# Patient Record
Sex: Female | Born: 1960 | Race: White | Hispanic: No
Health system: Southern US, Community
[De-identification: ages and names within clinical notes are randomized; demographics above are authoritative.]

---

## 2006-05-28 ENCOUNTER — Emergency Department: Payer: Self-pay | Admitting: Emergency Medicine

## 2006-06-03 ENCOUNTER — Emergency Department: Payer: Self-pay | Admitting: Emergency Medicine

## 2006-10-27 ENCOUNTER — Emergency Department: Payer: Self-pay | Admitting: Emergency Medicine

## 2006-11-19 ENCOUNTER — Other Ambulatory Visit: Payer: Self-pay

## 2006-11-19 ENCOUNTER — Emergency Department: Payer: Self-pay | Admitting: Emergency Medicine

## 2007-08-07 ENCOUNTER — Emergency Department: Payer: Self-pay | Admitting: Emergency Medicine

## 2007-08-08 ENCOUNTER — Emergency Department: Payer: Self-pay | Admitting: Emergency Medicine

## 2007-08-08 ENCOUNTER — Other Ambulatory Visit: Payer: Self-pay

## 2009-01-23 ENCOUNTER — Emergency Department: Payer: Self-pay | Admitting: Emergency Medicine

## 2009-02-27 ENCOUNTER — Emergency Department: Payer: Self-pay | Admitting: Emergency Medicine

## 2009-10-25 ENCOUNTER — Inpatient Hospital Stay: Payer: Self-pay | Admitting: Internal Medicine

## 2009-10-29 ENCOUNTER — Inpatient Hospital Stay: Payer: Self-pay | Admitting: Psychiatry

## 2009-11-17 ENCOUNTER — Inpatient Hospital Stay: Payer: Self-pay | Admitting: Internal Medicine

## 2010-05-10 ENCOUNTER — Ambulatory Visit: Payer: Self-pay

## 2010-10-06 ENCOUNTER — Emergency Department: Payer: Self-pay | Admitting: Unknown Physician Specialty

## 2011-06-02 ENCOUNTER — Emergency Department: Payer: Self-pay | Admitting: Emergency Medicine

## 2011-06-03 IMAGING — CT CT CHEST W/ CM
1 series · 15 of 32 positions shown, 19 images · IV contrast (APPLIED)
Comparison: none

REASON FOR EXAM: sob
COMMENTS:

PROCEDURE:     CT  - CT CHEST (FOR PE) W  - October 25, 2009  [DATE]
RESULT:     Chest CT dated 10/25/2008
TECHNIQUE: Helical 3 mm sections were obtained from the thoracic inlet
through the lung bases status post intravenous administration of 100 mL of
[id number].

[Series 4: soft tissue · axial · 0.62mm/px · z∈[-350,-96]mm · 15 of 96 slices shown, 19 images]
[im 7/96  soft-tissue]
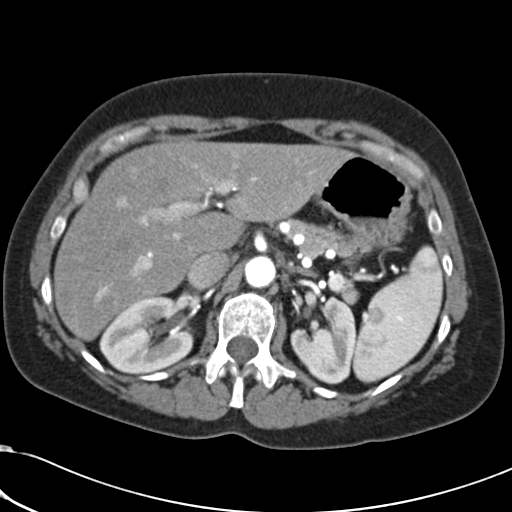
[im 7/96  bone]
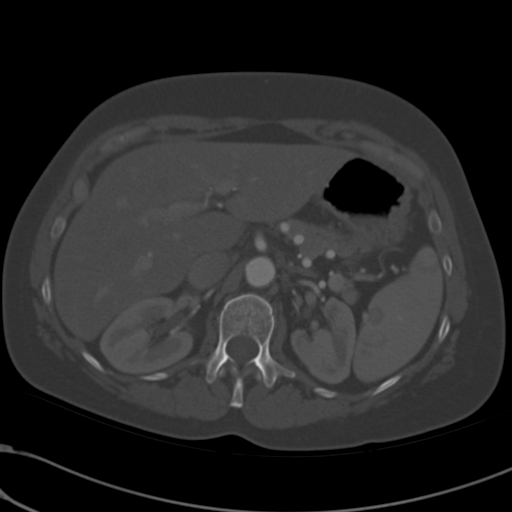
[im 13/96  soft-tissue]
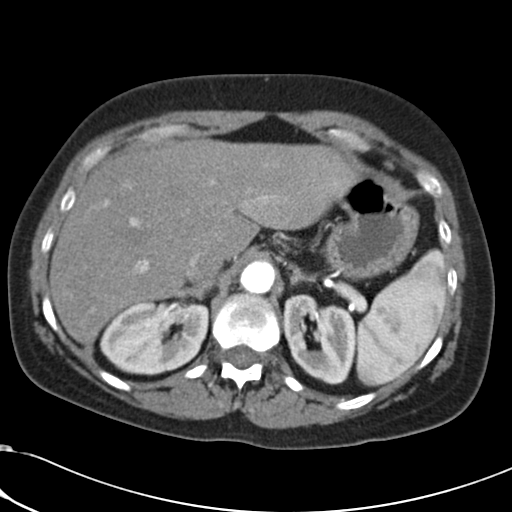
[im 19/96  soft-tissue]
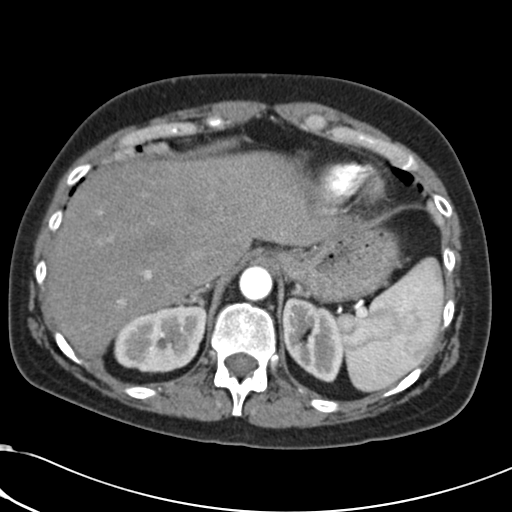
[im 28/96  soft-tissue]
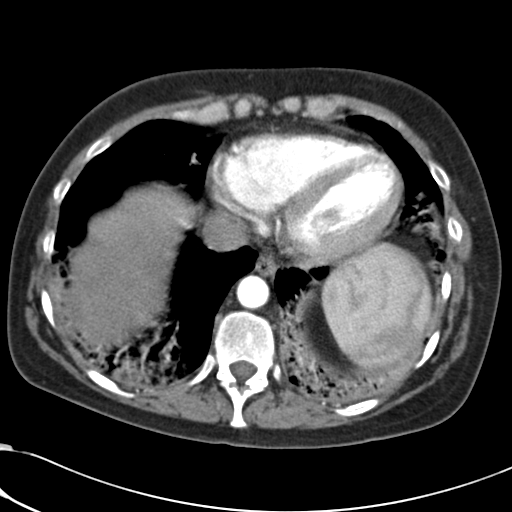
[im 34/96  soft-tissue]
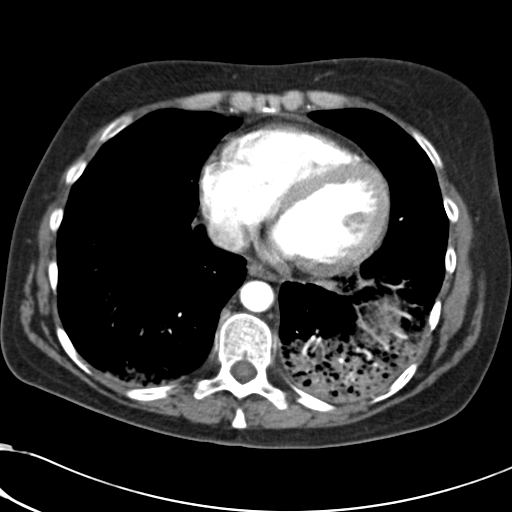
[im 40/96  soft-tissue]
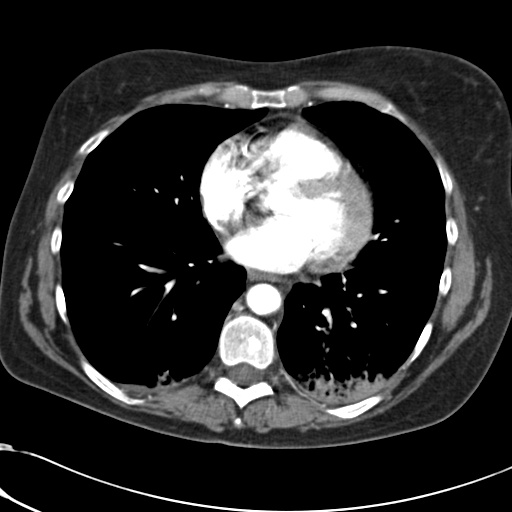
[im 50/96  soft-tissue]
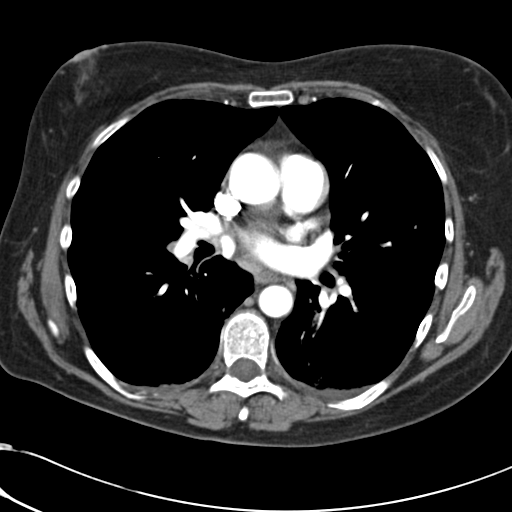
[im 56/96  soft-tissue]
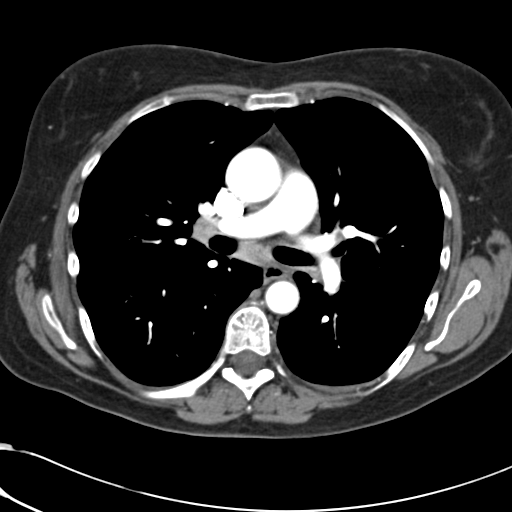
[im 62/96  soft-tissue]
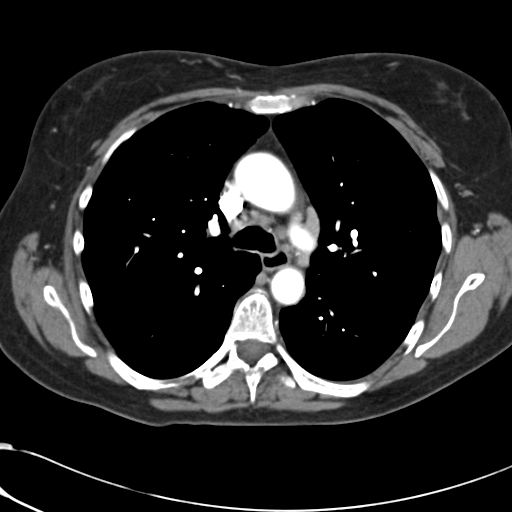
[im 62/96  bone]
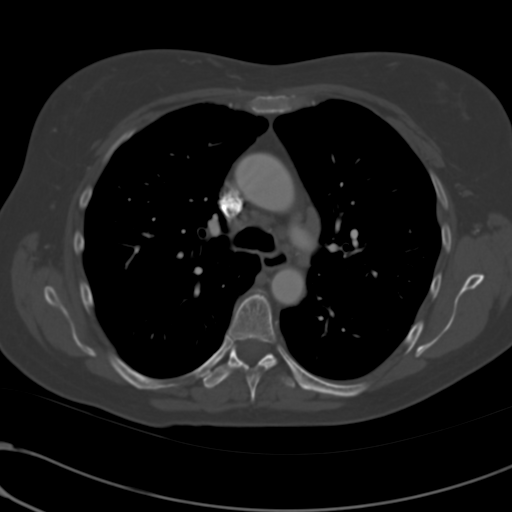
[im 68/96  soft-tissue]
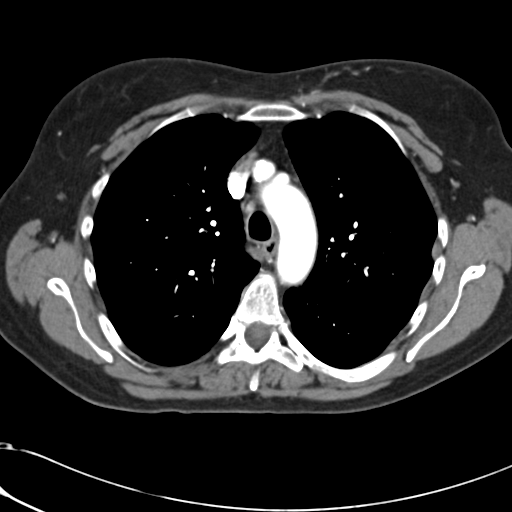
[im 77/96  soft-tissue]
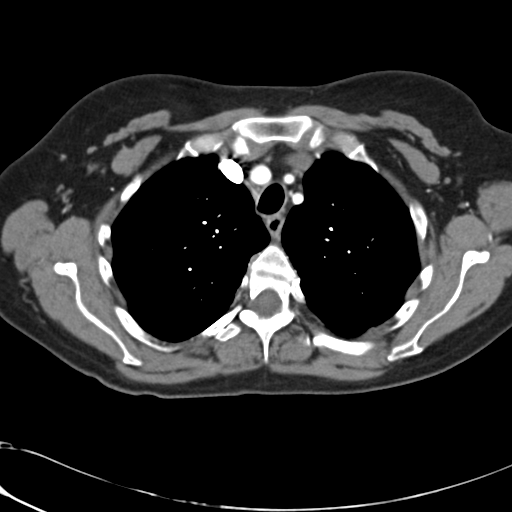
[im 83/96  soft-tissue]
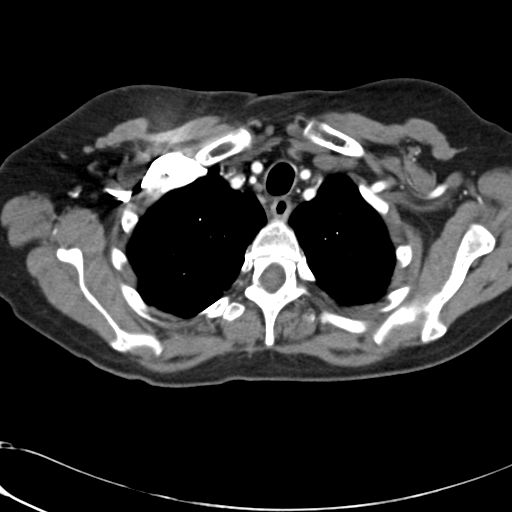
[im 83/96  lung]
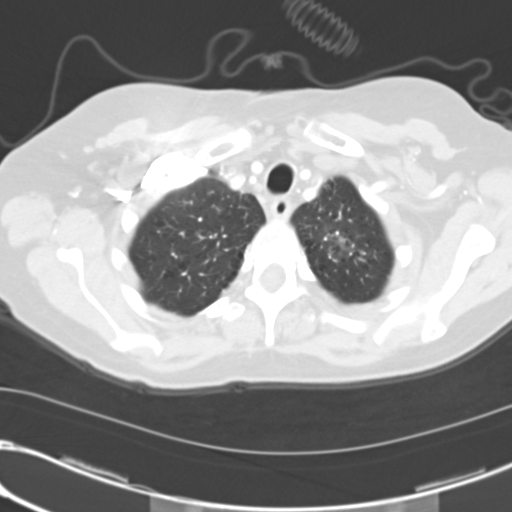
[im 86/96  lung]
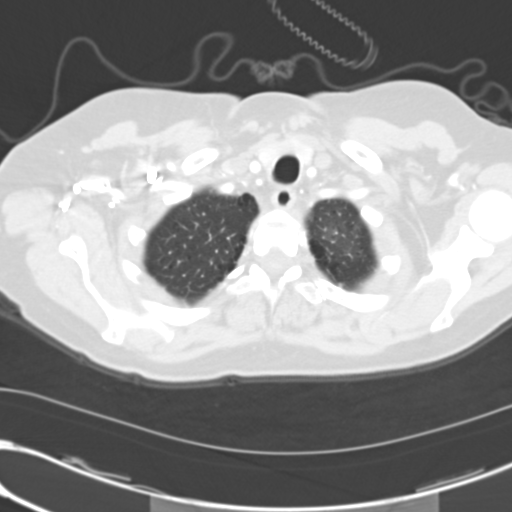
[im 89/96  soft-tissue]
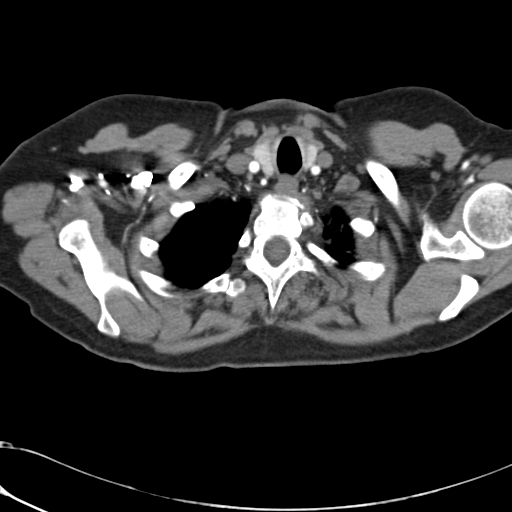
[im 89/96  lung]
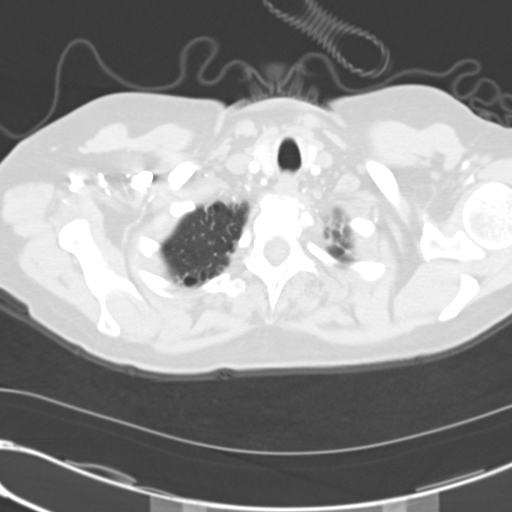
[im 92/96  lung]
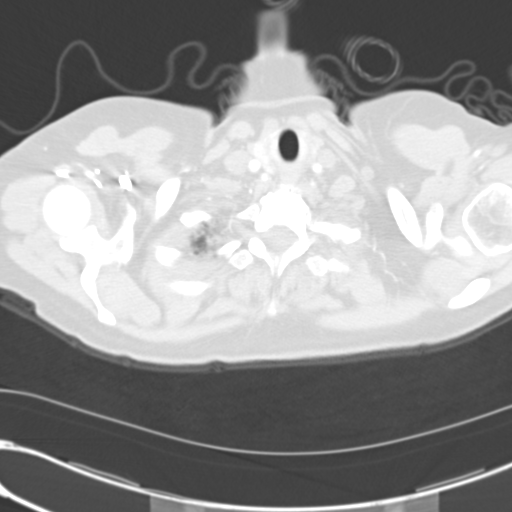

[15 of 32 positions shown; findings below may reference images not displayed]

FINDINGS: The mediastinum and hilar regions and structures demonstrates a
large lymph nodes an the AP window the largest measuring 2.39 x 1.08 cm.
Smaller paratracheal lymph nodes and infra- carinal lymph nodes identified.
Prominent bilateral hilar nodes are identified. There is no evidence of
filling defects within the main lobar or segmental pulmonary arteries. Is no
evidence of mediastinal masses. Evaluation of the lung parenchyma
demonstrates Caroline or infiltrates left greater than right. Very mild
ill-defined areas of increased interstitial densities are identified within
the remaining lung parenchyma on the right and left.

The visualized upper abdominal viscera demonstrate no gross abnormalities.
IMPRESSION: 1. No CT evidence of pulmonary arterial embolic disease
2. Bibasal airspace disease left greater than right has the appearance of
bibasal infiltrates.
3. Milder areas of increased interstitial densities are appreciated within
the remaining aerated portions of the lungs particularly within the lower
lobes also likely represent a component of an interstitial infiltrate.

## 2011-07-15 ENCOUNTER — Emergency Department: Payer: Self-pay | Admitting: Emergency Medicine

## 2011-08-05 ENCOUNTER — Inpatient Hospital Stay: Payer: Self-pay | Admitting: *Deleted

## 2011-08-13 ENCOUNTER — Emergency Department: Payer: Self-pay | Admitting: *Deleted

## 2011-10-16 ENCOUNTER — Emergency Department: Payer: Self-pay | Admitting: Emergency Medicine

## 2011-10-18 ENCOUNTER — Emergency Department: Payer: Self-pay | Admitting: *Deleted

## 2011-10-20 ENCOUNTER — Emergency Department: Payer: Self-pay | Admitting: *Deleted

## 2012-01-20 ENCOUNTER — Ambulatory Visit: Payer: Self-pay | Admitting: Internal Medicine

## 2012-01-26 ENCOUNTER — Inpatient Hospital Stay: Payer: Self-pay | Admitting: Internal Medicine

## 2012-01-26 LAB — COMPREHENSIVE METABOLIC PANEL
Albumin: 3.5 g/dL (ref 3.4–5.0)
Alkaline Phosphatase: 110 U/L (ref 50–136)
BUN: 22 mg/dL — ABNORMAL HIGH (ref 7–18)
Calcium, Total: 8.1 mg/dL — ABNORMAL LOW (ref 8.5–10.1)
Chloride: 105 mmol/L (ref 98–107)
Creatinine: 2.25 mg/dL — ABNORMAL HIGH (ref 0.60–1.30)
EGFR (African American): 30 — ABNORMAL LOW
EGFR (Non-African Amer.): 24 — ABNORMAL LOW
Glucose: 86 mg/dL (ref 65–99)
Osmolality: 282 (ref 275–301)
SGOT(AST): 141 U/L — ABNORMAL HIGH (ref 15–37)
SGPT (ALT): 42 U/L
Sodium: 140 mmol/L (ref 136–145)
Total Protein: 7.4 g/dL (ref 6.4–8.2)

## 2012-01-26 LAB — DRUG SCREEN, URINE
Barbiturates, Ur Screen: NEGATIVE (ref ?–200)
Cocaine Metabolite,Ur ~~LOC~~: POSITIVE (ref ?–300)
MDMA (Ecstasy)Ur Screen: NEGATIVE (ref ?–500)
Methadone, Ur Screen: NEGATIVE (ref ?–300)
Opiate, Ur Screen: POSITIVE (ref ?–300)

## 2012-01-26 LAB — CBC
HCT: 36.4 % (ref 35.0–47.0)
HGB: 12.1 g/dL (ref 12.0–16.0)
MCH: 32.4 pg (ref 26.0–34.0)
MCHC: 33.3 g/dL (ref 32.0–36.0)
Platelet: 220 10*3/uL (ref 150–440)
RBC: 3.74 10*6/uL — ABNORMAL LOW (ref 3.80–5.20)
RDW: 14.8 % — ABNORMAL HIGH (ref 11.5–14.5)
WBC: 12.6 10*3/uL — ABNORMAL HIGH (ref 3.6–11.0)

## 2012-01-26 LAB — CK: CK, Total: 2652 U/L — ABNORMAL HIGH (ref 21–215)

## 2012-01-26 LAB — URINALYSIS, COMPLETE
Bilirubin,UR: NEGATIVE
Glucose,UR: NEGATIVE mg/dL (ref 0–75)
Ketone: NEGATIVE
Protein: 100
RBC,UR: 1 /HPF (ref 0–5)
Specific Gravity: 1.011 (ref 1.003–1.030)
Squamous Epithelial: 1

## 2012-01-26 LAB — TROPONIN I: Troponin-I: 0.89 ng/mL — ABNORMAL HIGH

## 2012-01-27 DIAGNOSIS — R748 Abnormal levels of other serum enzymes: Secondary | ICD-10-CM

## 2012-01-27 LAB — CBC WITH DIFFERENTIAL/PLATELET
Basophil #: 0 10*3/uL (ref 0.0–0.1)
Eosinophil %: 0.1 %
HCT: 35.7 % (ref 35.0–47.0)
HGB: 12 g/dL (ref 12.0–16.0)
Lymphocyte %: 5.4 %
MCH: 32 pg (ref 26.0–34.0)
MCHC: 33.6 g/dL (ref 32.0–36.0)
MCV: 96 fL (ref 80–100)
Monocyte %: 11.5 %
Neutrophil #: 10 10*3/uL — ABNORMAL HIGH (ref 1.4–6.5)
Platelet: 252 10*3/uL (ref 150–440)
RDW: 14.8 % — ABNORMAL HIGH (ref 11.5–14.5)
WBC: 12.1 10*3/uL — ABNORMAL HIGH (ref 3.6–11.0)

## 2012-01-27 LAB — TROPONIN I
Troponin-I: 7.8 ng/mL — ABNORMAL HIGH
Troponin-I: 7.72 ng/mL — ABNORMAL HIGH

## 2012-01-27 LAB — BASIC METABOLIC PANEL
BUN: 27 mg/dL — ABNORMAL HIGH (ref 7–18)
Calcium, Total: 7.3 mg/dL — ABNORMAL LOW (ref 8.5–10.1)
Chloride: 106 mmol/L (ref 98–107)
Co2: 24 mmol/L (ref 21–32)
EGFR (African American): 35 — ABNORMAL LOW
EGFR (Non-African Amer.): 29 — ABNORMAL LOW
Glucose: 93 mg/dL (ref 65–99)
Potassium: 5.1 mmol/L (ref 3.5–5.1)

## 2012-01-27 LAB — CK TOTAL AND CKMB (NOT AT ARMC)
CK, Total: 9476 U/L — ABNORMAL HIGH (ref 21–215)
CK, Total: 7135 U/L — ABNORMAL HIGH (ref 21–215)
CK-MB: 151.3 ng/mL — ABNORMAL HIGH (ref 0.5–3.6)

## 2012-01-28 DIAGNOSIS — I059 Rheumatic mitral valve disease, unspecified: Secondary | ICD-10-CM

## 2012-01-28 LAB — COMPREHENSIVE METABOLIC PANEL
Alkaline Phosphatase: 73 U/L (ref 50–136)
BUN: 18 mg/dL (ref 7–18)
Creatinine: 0.96 mg/dL (ref 0.60–1.30)
EGFR (African American): >60
Glucose: 125 mg/dL — ABNORMAL HIGH (ref 65–99)
Osmolality: 290 (ref 275–301)
Potassium: 4.6 mmol/L (ref 3.5–5.1)
SGPT (ALT): 57 U/L
Sodium: 144 mmol/L (ref 136–145)
Total Protein: 6 g/dL — ABNORMAL LOW (ref 6.4–8.2)

## 2012-01-28 LAB — CBC WITH DIFFERENTIAL/PLATELET
Basophil %: 0.6 %
Eosinophil %: 0 %
HGB: 11.2 g/dL — ABNORMAL LOW (ref 12.0–16.0)
MCH: 32.4 pg (ref 26.0–34.0)
MCHC: 33.8 g/dL (ref 32.0–36.0)
MCV: 96 fL (ref 80–100)
Neutrophil #: 9.5 10*3/uL — ABNORMAL HIGH (ref 1.4–6.5)
Neutrophil %: 85.9 %
Platelet: 176 10*3/uL (ref 150–440)
RDW: 14.6 % — ABNORMAL HIGH (ref 11.5–14.5)
WBC: 11.1 10*3/uL — ABNORMAL HIGH (ref 3.6–11.0)

## 2012-01-29 LAB — BASIC METABOLIC PANEL
BUN: 18 mg/dL (ref 7–18)
Co2: 23 mmol/L (ref 21–32)
Creatinine: 0.87 mg/dL (ref 0.60–1.30)
EGFR (Non-African Amer.): >60
Glucose: 128 mg/dL — ABNORMAL HIGH (ref 65–99)
Potassium: 3.5 mmol/L (ref 3.5–5.1)

## 2012-01-29 LAB — CBC WITH DIFFERENTIAL/PLATELET
Basophil %: 0 %
Eosinophil #: 0 10*3/uL (ref 0.0–0.7)
HGB: 10 g/dL — ABNORMAL LOW (ref 12.0–16.0)
Lymphocyte %: 2.8 %
MCH: 32.1 pg (ref 26.0–34.0)
MCV: 95 fL (ref 80–100)
Monocyte %: 4.1 %
Neutrophil #: 9.6 10*3/uL — ABNORMAL HIGH (ref 1.4–6.5)
Neutrophil %: 93.1 %
Platelet: 158 10*3/uL (ref 150–440)
RBC: 3.12 10*6/uL — ABNORMAL LOW (ref 3.80–5.20)
RDW: 14.4 % (ref 11.5–14.5)

## 2012-01-29 LAB — URINE CULTURE

## 2012-01-29 LAB — CK: CK, Total: 2190 U/L — ABNORMAL HIGH (ref 21–215)

## 2012-01-29 LAB — TROPONIN I: Troponin-I: 2.63 ng/mL — ABNORMAL HIGH

## 2012-01-29 LAB — CK TOTAL AND CKMB (NOT AT ARMC): CK-MB: 7.6 ng/mL — ABNORMAL HIGH (ref 0.5–3.6)

## 2012-01-30 LAB — CK TOTAL AND CKMB (NOT AT ARMC): CK-MB: 3.6 ng/mL (ref 0.5–3.6)

## 2012-01-30 LAB — CBC WITH DIFFERENTIAL/PLATELET
Basophil #: 0 10*3/uL (ref 0.0–0.1)
Eosinophil #: 0 10*3/uL (ref 0.0–0.7)
HCT: 28.7 % — ABNORMAL LOW (ref 35.0–47.0)
HGB: 9.6 g/dL — ABNORMAL LOW (ref 12.0–16.0)
Lymphocyte %: 3.8 %
MCH: 31.8 pg (ref 26.0–34.0)
MCHC: 33.3 g/dL (ref 32.0–36.0)
MCV: 96 fL (ref 80–100)
Monocyte %: 5 %
Neutrophil #: 8.7 10*3/uL — ABNORMAL HIGH (ref 1.4–6.5)
Neutrophil %: 91.2 %
Platelet: 169 10*3/uL (ref 150–440)
RBC: 3 10*6/uL — ABNORMAL LOW (ref 3.80–5.20)
RDW: 14.7 % — ABNORMAL HIGH (ref 11.5–14.5)
WBC: 9.5 10*3/uL (ref 3.6–11.0)

## 2012-01-30 LAB — BASIC METABOLIC PANEL
Co2: 23 mmol/L (ref 21–32)
Creatinine: 1.06 mg/dL (ref 0.60–1.30)
EGFR (African American): >60
EGFR (Non-African Amer.): 58 — ABNORMAL LOW
Glucose: 126 mg/dL — ABNORMAL HIGH (ref 65–99)
Potassium: 3.5 mmol/L (ref 3.5–5.1)
Sodium: 143 mmol/L (ref 136–145)

## 2012-01-30 LAB — PHOSPHORUS: Phosphorus: 2.6 mg/dL (ref 2.5–4.9)

## 2012-01-30 LAB — VANCOMYCIN, TROUGH: Vancomycin, Trough: 20 ug/mL (ref 10–20)

## 2012-02-01 LAB — BASIC METABOLIC PANEL
BUN: 38 mg/dL — ABNORMAL HIGH (ref 7–18)
Chloride: 114 mmol/L — ABNORMAL HIGH (ref 98–107)
Co2: 22 mmol/L (ref 21–32)
Creatinine: 0.93 mg/dL (ref 0.60–1.30)
EGFR (African American): >60
Glucose: 143 mg/dL — ABNORMAL HIGH (ref 65–99)
Osmolality: 300 (ref 275–301)
Sodium: 145 mmol/L (ref 136–145)

## 2012-02-01 LAB — CBC WITH DIFFERENTIAL/PLATELET
Basophil #: 0 10*3/uL (ref 0.0–0.1)
HCT: 30.5 % — ABNORMAL LOW (ref 35.0–47.0)
MCH: 31.7 pg (ref 26.0–34.0)
MCV: 95 fL (ref 80–100)
Monocyte #: 0.6 x10 3/mm (ref 0.2–0.9)
Monocyte %: 6.7 %
Neutrophil %: 87.1 %
RBC: 3.23 10*6/uL — ABNORMAL LOW (ref 3.80–5.20)

## 2012-02-01 LAB — LIPID PANEL
Cholesterol: 149 mg/dL (ref 0–200)
HDL Cholesterol: 31 mg/dL — ABNORMAL LOW (ref 40–60)
Ldl Cholesterol, Calc: 93 mg/dL (ref 0–100)
Triglycerides: 125 mg/dL (ref 0–200)
VLDL Cholesterol, Calc: 25 mg/dL (ref 5–40)

## 2012-02-01 LAB — CULTURE, BLOOD (SINGLE)

## 2012-02-02 LAB — COMPREHENSIVE METABOLIC PANEL
Albumin: 2.4 g/dL — ABNORMAL LOW (ref 3.4–5.0)
Alkaline Phosphatase: 64 U/L (ref 50–136)
Anion Gap: 9 (ref 7–16)
Chloride: 113 mmol/L — ABNORMAL HIGH (ref 98–107)
Co2: 27 mmol/L (ref 21–32)
EGFR (African American): >60
Glucose: 88 mg/dL (ref 65–99)

## 2012-02-03 LAB — BASIC METABOLIC PANEL
Anion Gap: 9 (ref 7–16)
BUN: 31 mg/dL — ABNORMAL HIGH (ref 7–18)
Calcium, Total: 8.3 mg/dL — ABNORMAL LOW (ref 8.5–10.1)
Chloride: 106 mmol/L (ref 98–107)
Co2: 27 mmol/L (ref 21–32)
Creatinine: 0.86 mg/dL (ref 0.60–1.30)
EGFR (African American): >60
Sodium: 142 mmol/L (ref 136–145)

## 2012-02-05 LAB — PLATELET COUNT: Platelet: 236 10*3/uL (ref 150–440)

## 2012-02-19 ENCOUNTER — Ambulatory Visit: Payer: Self-pay | Admitting: Internal Medicine

## 2013-09-05 IMAGING — US US CAROTID DUPLEX BILAT
1 series · 17 of 24 positions shown · non-contrast
Comparison: none

REASON FOR EXAM: acute stroke
COMMENTS:

[Series 1: us carotid duplex bilat · 17 of 55 slices shown]
[im 1/55]
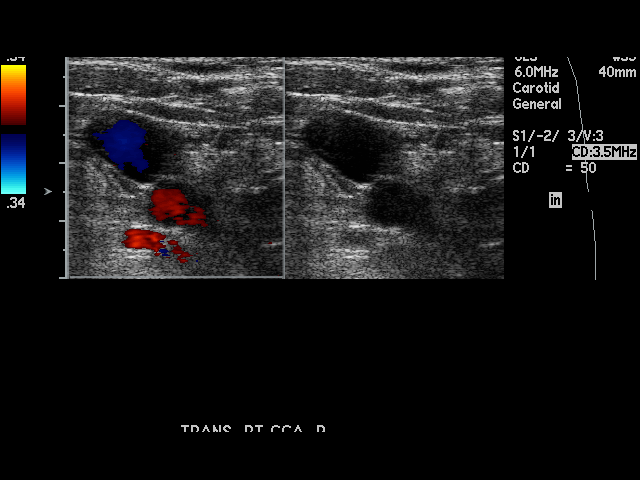
[im 5/55]
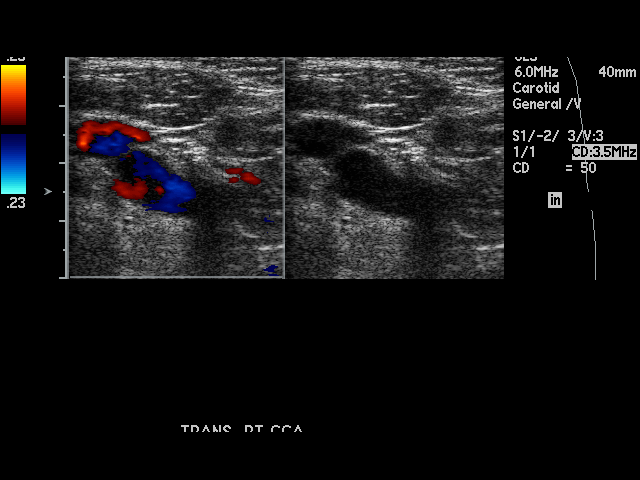
[im 8/55]
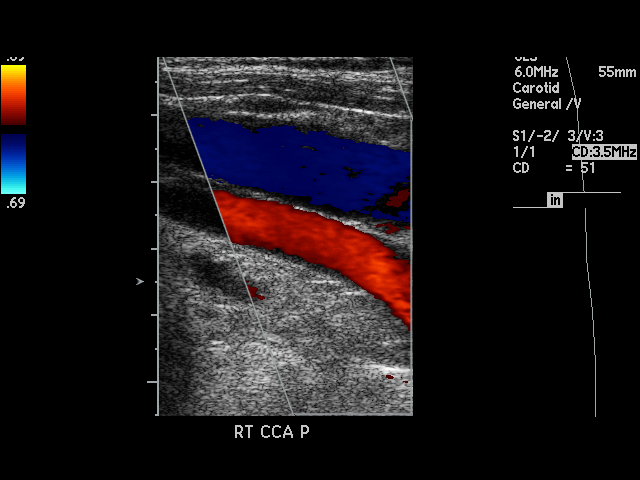
[im 10/55]
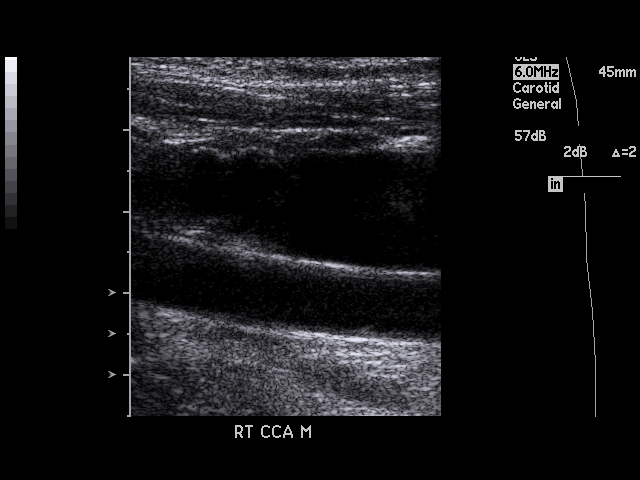
[im 15/55]
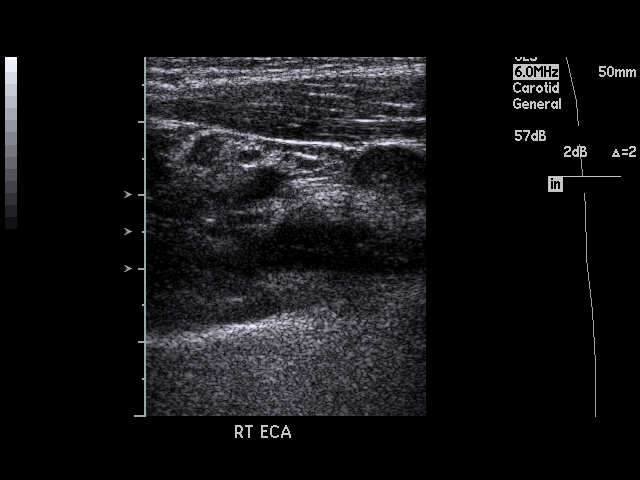
[im 17/55]
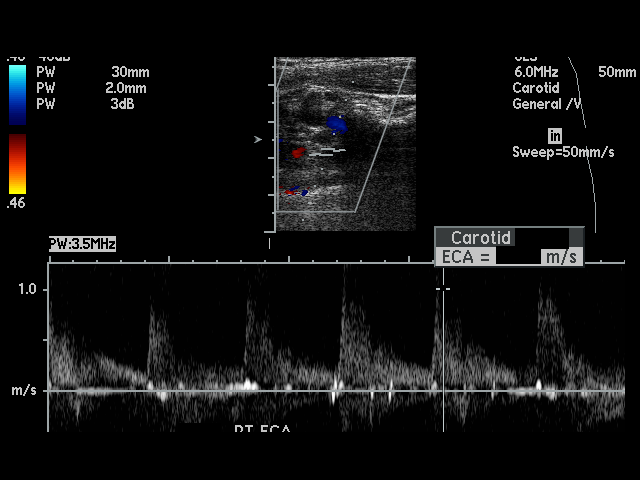
[im 22/55]
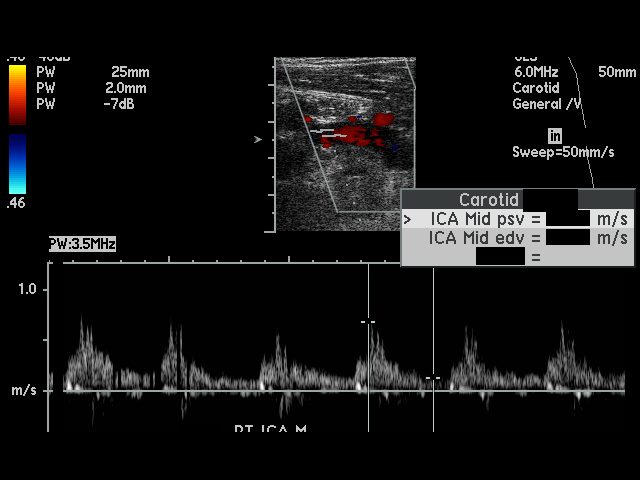
[im 24/55]
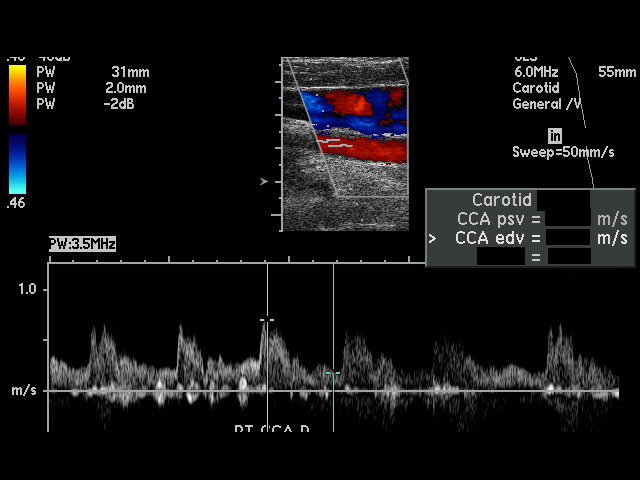
[im 29/55]
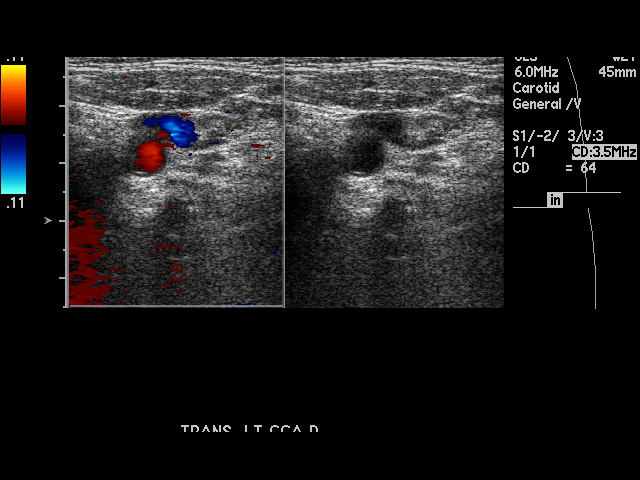
[im 31/55]
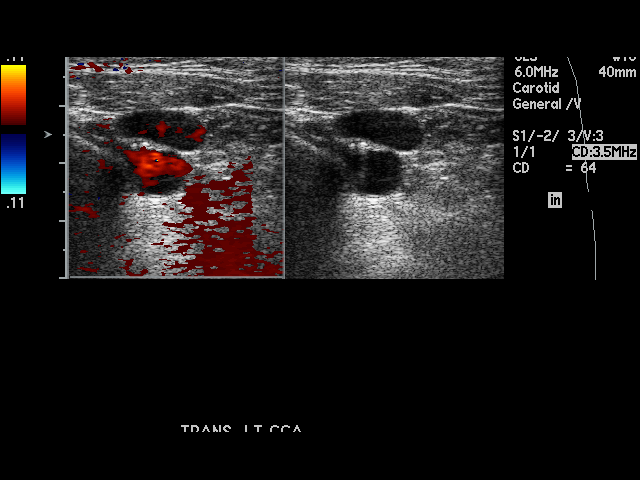
[im 33/55]
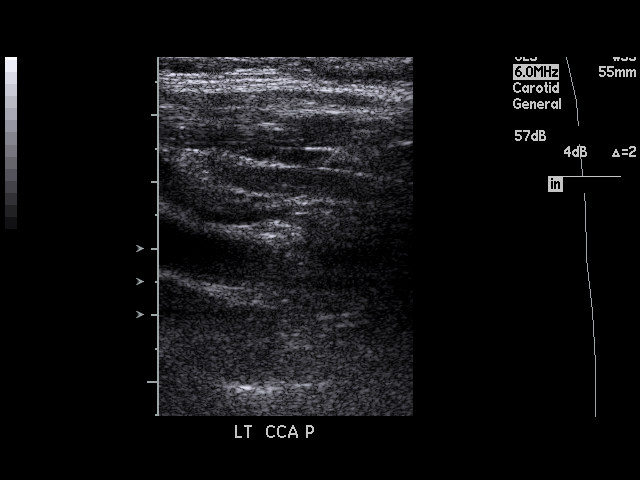
[im 38/55]
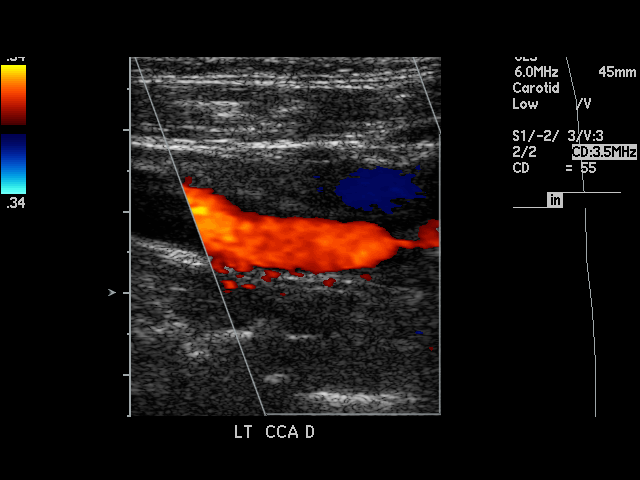
[im 40/55]
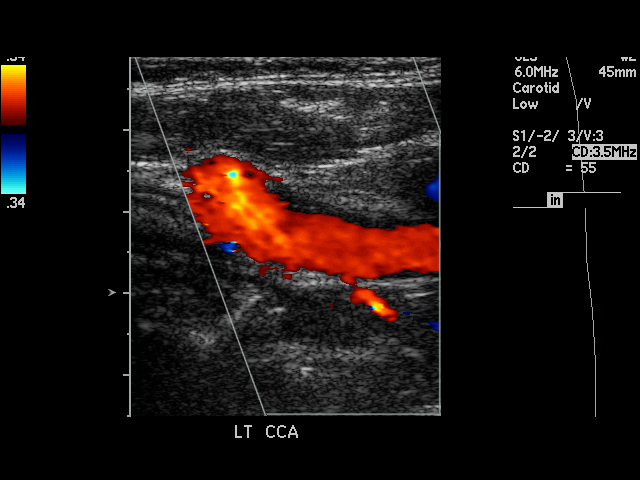
[im 45/55]
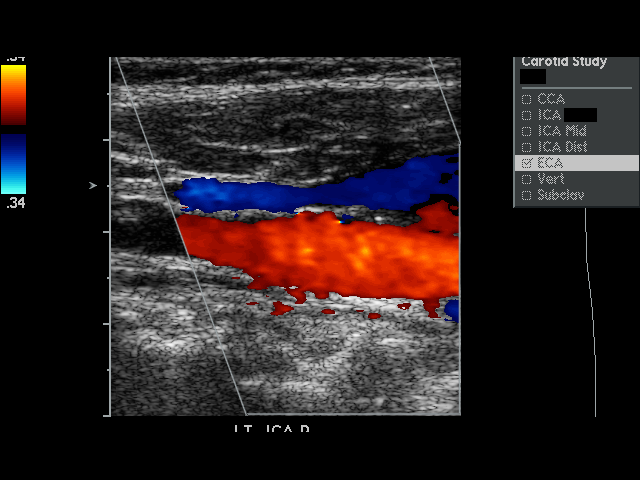
[im 47/55]
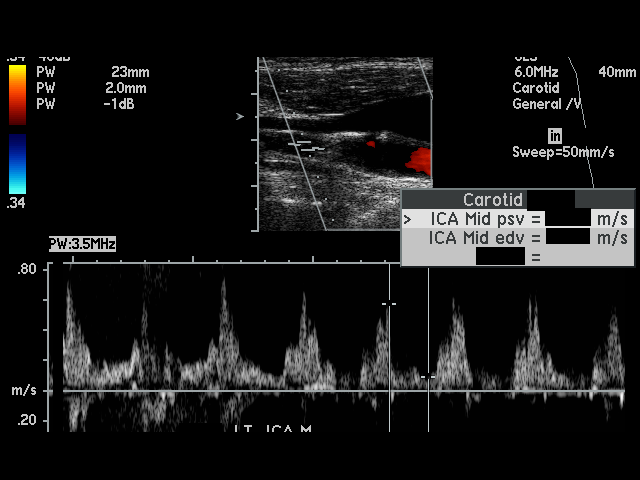
[im 50/55]
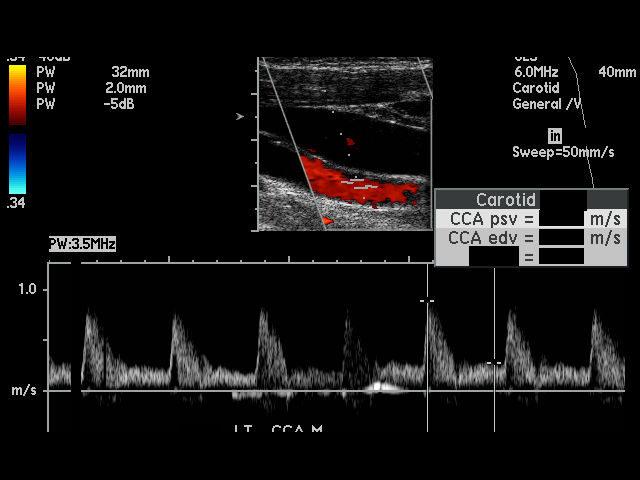
[im 55/55]
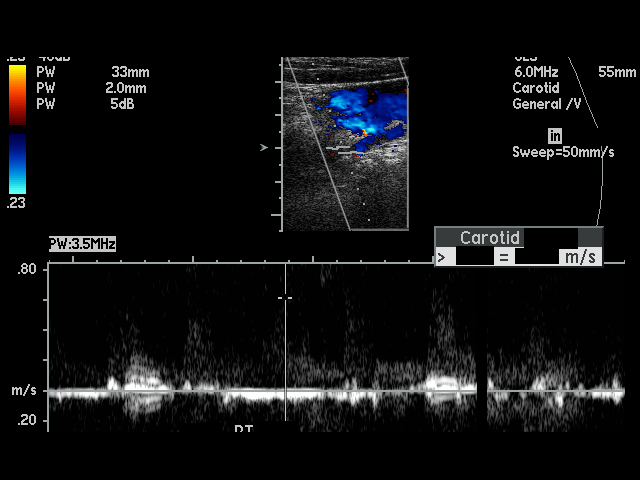

[17 of 24 positions shown; findings below may reference images not displayed]

PROCEDURE:     US  - US CAROTID DOPPLER BILATERAL  - January 28, 2012  [DATE]

RESULT:     There is noted slight calcific plaque formation about the
carotid bifurcations bilaterally. On the right, the peak right common
carotid artery flow velocity measure 0.767 meters per second and the peak
right internal carotid artery flow velocity measures 0.811 meters per
second. The ICA/CCA ratio is 1.057. On the left, the peak left common
carotid artery flow velocity measures 0.897 meters per second and the peak
left internal carotid artery flow velocity measures 0.704 meters per second.
The ICA/CCA ratio is 0.785. These values bilaterally are in the normal range
and are consistent with the bilateral absence of hemodynamically significant
stenosis.

There is antegrade flow in both vertebrals.
IMPRESSION: 1. No hemodynamically significant stenosis is identified on either side.
2. There is antegrade flow in both vertebrals.

## 2015-02-12 NOTE — Consult Note (Signed)
Comments   Events of morning noted. Pt now on bipap for respiratory distress. Remains agitated. Billey Chang, NP, and I met with pt's son and 2 sisters. Updated them on pt's current medical condition. Addressed code status. They are adamant that they do not want pt intubated. We discussed CPR/defibrillation and they do not want that in the event of cardiac arrest. Pt is DNR.  reviewed potential scenarios in the event pt survives this acute decompensation. Family remains determined to eventually get pt back home in their care. They would agree with PEG if needed. We continue to follow and meet with family as needed to assist with decision making. expressed appreciation for meeting. All questions answered.   Electronic Signatures: Demecia Northway, Izora Gala (MD)  (Signed 11-Apr-13 14:22)  Authored: Palliative Care   Last Updated: 11-Apr-13 14:22 by Mickle Campton, Izora Gala (MD)

## 2015-02-12 NOTE — Discharge Summary (Signed)
PATIENT NAME:  Janice OrionBRADSHAW, Ranita H MR#:  [id number] DATE OF BIRTH:  Jun 17, 1961  DATE OF ADMISSION:  01/26/2012 DATE OF DISCHARGE:  02/07/2012  ADDENDUM:  This is the final Discharge Summary and addendum to the Interim Discharge Summary dictated by Dr. Chales AbrahamsGupta.    HOSPITAL COURSE: I followed the patient on the 18th and 19th of April. She is doing well. She is being discharged to [facility] for further rehab. She will follow up with [facility] after discharge, phone number [phone].   DISCHARGE INSTRUCTIONS:  1. Physical therapy.  2. Mechanical soft diet, Ensure 240 mL b.i.d.  3. DuoNebs every 4 hours p.r.n.  4. Follow up with [facility] after discharge from rehab. Phone number is [phone].  5. Check serum Tegretol levels in 5 days.   DISCHARGE MEDICATIONS:  1. Albuterol oral inhaler 1 to 2 puffs every 6 hours p.r.n.  2. Metoprolol 25 mg b.i.d.  3. Aspirin 324 mg p.o. daily.  4. Hydralazine 25 mg p.o. t.i.d.  5. Amlodipine 10 mg daily.  6. Ativan 0.5 mg p.o. t.i.d. p.r.n.  7. Tegretol 200 mg p.o. b.i.d.  8. Zoloft 50 mg p.o. daily.  9. Prednisone taper 30 mg daily, taper x10 mg daily, then stop.   CODE STATUS:  NO CODE/DO NOT RESUSCITATE.    ____________________________ Wylie HailSona A. Allena KatzPatel, MD sap:cbb D: 02/07/2012 11:02:30 ET T: 02/07/2012 13:25:10 ET JOB#: [id number] Jearl KlinefelterSONA A Taelyn Broecker MD ELECTRONICALLY SIGNED 02/10/2012 13:20

## 2015-02-12 NOTE — Consult Note (Signed)
PATIENT NAME:  Janice Mccullough, Janice Mccullough MR#:  [id number] DATE OF BIRTH:  1961-04-21  DATE OF CONSULTATION:  01/29/2012  REFERRING PHYSICIAN:  Marlaine Hind, MD  CONSULTING PHYSICIAN:  Rose Phi. Kemper Durie, MD  HISTORY: Ms. Ferner is 54 year old white woman with reported history of bipolar disorder, hypertension, tobacco abuse, and COPD. She was admitted 01/26/2012 after being found unresponsive. She is referred for evaluation of unresponsive state, stroke, and hypothermia. History comes from her CCU nurse, conversation with Dr. Mariah Milling of Cardiology, and from her hospital chart.   The patient was brought to the Emergency Room at 8:30 p.m. on 01/26/2012 by EMTs summoned by a friend who had found the patient unresponsive. Initial vital signs in the Emergency Room included blood pressure of 129/71, heart rate 66, and temperature 89.4. Urine drug screen was positive for cocaine, opiates, and phencyclidine. It is  reported that the patient had been found unresponsive at approximately 6 a.m. the same day and that the call to 9-1-1 14 hours later had been made when the patient was still unresponsive, this delay reportedly possibly due to possibly illicit drug use, or allowing the patient to "sleep it off", or both and also concern about police involvement.   She was intubated and admitted to the CCU. Brain CT scan showed no bleed or mass. Scan showed areas of decreased density consistent with subacute infarction of bilateral cerebral hemispheres and basal ganglia areas bilaterally. She was seen by Dr. Mariah Milling of Cardiology for elevated enzymes, the patient was concluded to have had non-ST elevation myocardial infarction with question of demand ischemia secondary to hypoxia or cocaine or both.   The patient was extubated without complication on 01/27/2012. Brain MRI scan 01/28/2012 was of fair quality with regard to artifact, but confirmed areas of nonhemorrhagic infarction of left more than right cerebellum, possibly  adjacent pons, and areas of small infarction of bilateral basal ganglia regions and of periventricular white matter regions bilaterally.   PHYSICAL EXAMINATION: When seen the evening of 01/29/2012, the patient was examined lying semisupine in the CCU with nasal cannula oxygen in place. She was well developed, well nourished, appeared in no apparent distress, lying with her eyes closed. Blood pressure 160/95, heart rate 86, respirations 14 per minute. There was no fever. She was normocephalic without evidence of trauma and her neck was supple to passive movement. She was arousable to mild shaking or loud voice, briefly opening the eyelids. She could be induced to nod "yes" to Rosangela somewhat more than to other choices of name. She otherwise did not verbalize. With combination of loud voice, vigorous tactile cues, exhortation, and repeated requests, she could be induced to brief grip of the hands more so on the right than the left, to make some effort to raise the arms with better movement on the right than on the left, to extend legs and knees against resistance with more strength in the right than the left, command to briefly tap the right hand and tap the right foot, not the left hand or foot.   Her gaze was conjugate in the midline. She did not have roving eye movements throughout her left, but could have elicitable doll movement to each side of midline. Corneal responses were brisk bilaterally. Pupils were bilaterally 4 mm, decreased to 3 mm to light stimulus. Her visual acuity was grossly intact to visual threat. There was grossly symmetric facial grimace to noxious intranasal stimulation. She demonstrated localizing movement with each hand toward the face, better on the right  than left. Tone was mildly increased on the left compared to the right. Reflexes were all 2 to 3+, but more brisk on the left than the right. Babinski sign was absent bilaterally.   IMPRESSION: Left greater than right side weakness  with brain imaging studies showing multiple areas of recent infarction suspected associated with cocaine use, and decreased level of consciousness at this point of unclear etiology with differential diagnosis including involvement of brainstem areas not seen on suboptimal imaging study, and the possibility of diffuse anoxic brain insult.   RECOMMENDATIONS:  1. I agree with her present work-up and treatment in the hospital. 2. Repeat MRI scan a later time for better study.  3. EEG to evaluate level of slowing.  4. She will need a period of rehabilitation, [facility] at time of discharge from the hospital.   I appreciate being asked to see this interesting lady.  ____________________________ Rose PhiPeter R. Kemper Durielarke, MD prc:drc D: 01/29/2012 13:45:33 ET T: 01/29/2012 14:36:47 ET JOB#: [id number]  cc: Rose PhiPeter R. Kemper Durielarke, MD, <Dictator> Gaspar GarbePETER R Aliscia Clayton MD ELECTRONICALLY SIGNED 02/12/2012 11:04

## 2015-02-12 NOTE — H&P (Signed)
PATIENT NAME:  Janice Mccullough, Janice Mccullough MR#:  [id number] DATE OF BIRTH:  20-Oct-1961  DATE OF ADMISSION:  01/26/2012  PRIMARY CARE PHYSICIAN:  The patient does not have a local doctor.   CHIEF COMPLAINT: The patient was found unresponsive.   HISTORY OF PRESENT ILLNESS: Ms. Dubiel is a 54 year old Caucasian female with past medical history of chronic obstructive pulmonary disease, tobacco abuse, systemic hypertension, and bipolar disorder. The patient was found in her apartment by her friend unresponsive. The patient was transferred to the Emergency Department and evaluation here with CT scan revealed multiple cerebral and cerebellar infarcts. Along with that her drug screen was positive for cocaine, opiate, and phencyclidine. The patient's neurologic condition appears to be poor. She is intubated and connected to a ventilator.   REVIEW OF SYSTEMS: 10-point system review is unobtainable due to the patient's unresponsiveness and being on the ventilator.   PAST MEDICAL HISTORY:  1. History of chronic obstructive pulmonary disease. 2. Systemic hypertension.  3. Bipolar disorder.  4. Tobacco abuse.   ADMISSION MEDICATIONS: Per records, last admission here was in October 2012. When she was discharged she was on: 1. Valium 5 mg 4 times a day.  2. Tegretol 200 mg twice a day.  3. Norvasc 5 mg a day. 4. Norco 5/325 q. 6 hours p.r.n.  5. Zestril  20 mg a day. 6. Ambien 10 mg at bedtime. 7. Abilify 5 mg a day. 8. Advair 250/50 1 puff twice a day.   ALLERGIES: No known drug allergies.  SOCIAL HABITS: History of tobacco abuse. I could not assess her amount of tobacco or when she started smoking due to the patient's condition.  Per records she was not an alcoholic.   SOCIAL HISTORY: She lives at home, visited by a friend.   FAMILY HISTORY: According to the records, positive for hypertension and coronary artery disease.   PHYSICAL EXAMINATION:  VITAL SIGNS: Blood pressure 160/94, respiratory rate 14 on  ventilator, pulse 68, temperature 85.6, oxygen saturation 100% on oxygen supplementation 35%.   GENERAL APPEARANCE: Middle-aged female laying in bed, unresponsive, on ventilator.   HEENT: No pallor. No icterus. No cyanosis. ENT cannot be assessed.  The patient is on the ventilator and unresponsive. She is intubated orally.  Eyes- normal eyelids and conjunctivae. Pupils about 3 mm, unresponsive to light.   NECK: Supple. Trachea at midline. No thyromegaly. No cervical lymphadenopathy.   HEART: Faint S1, S2 or distant heart sounds secondary to her chronic obstructive pulmonary disease and also the noise from rhonchi and ventilator. No murmur was appreciated. No carotid bruits.   RESPIRATORY: Bilateral diffuse rhonchi and wheezing with slight prolongation of expiratory phase. No rales.   ABDOMEN: Slightly distended, soft without rigidity. No masses. No organomegaly.   MUSCULOSKELETAL: No joint swelling. No clubbing.   SKIN: No ulcers. No subcutaneous nodules.   NEUROLOGIC: The patient is unresponsive. No facial asymmetry. She is flaccid in four extremities. Plantar responses are zero, no upgoing or downgoing. Pupils about 3 mm, equal, nonreactive to light. Corneal reflex is absent.   LABORATORY, DIAGNOSTIC, AND RADIOLOGICAL DATA: CT scan of the head without contrast revealed extensive abnormal attenuation within the cerebellum consistent with subacute infarct. There is also abnormal attenuation within the bilateral caudate lobes and basal ganglia concerning for subacute lacunar infarct. The radiologist also reported that the fourth ventricle is small in size. The patient is at risk for hydrocephalus due to cerebellar edema. No hydrocephalus is seen on the current exam.  CT scan of  the abdomen and pelvis: There is linear atelectasis or fibrosis in the lung bases. Gallstones.  Mild mucosal thickening in small bowel loops. EKG showed ectopic atrial rhythm at the rate of 58 per minute. Otherwise  unremarkable EKG. Arterial  blood gas showed the following readings:  pH 7.2, pCO2 20, pO2 49. Subsequent ABG showed pH of 7.38, pCO2 35, and pO2 of 80. The final one was pH 7.6, pCO2 69, pO2 300. Serum glucose 86, BUN 22, creatinine 2.25, sodium 140, potassium 6.1. Creatinine in October of last year was 0.7. Her alcohol level is less than 0.003. Liver function tests were normal except for elevated AST at 141. Troponin is elevated at 0.89. Drug screen was positive for cocaine, opiates, and phencyclidine. CBC showed elevated white count at 12,600, hemoglobin 12, hematocrit 36, platelet count 220. Urinalysis showed cloudy urine, +3 blood, 9 white blood cells.   IMPRESSION:  1. Acute stroke.  2. Unresponsiveness. The patient is intubated on the ventilator. 3. Hypothermia. 4. Acute renal failure.  5. Hyperkalemia.  6. Elevated troponin with question of non-ST elevation acute myocardial infarction.  7. Drug overdose with cocaine, opiate, and phencyclidine. 8. Possible underlying urinary tract infection.  9. Chronic obstructive pulmonary disease.  10. Systemic hypertension.  11. History of bipolar disorder.  12. Prognosis is poor.   PLAN:  The patient will be admitted to the Intensive Care Unit. We will continue ventilatory support. Frequent neurologic examination and followup. Aspirin 325 mg once a day. Deep vein thrombosis prophylaxis with Lovenox 40 mg subcutaneous once a day. Peptic ulcer disease prophylaxis with Protonix 40 mg IV once a day. IV antibiotic Levaquin at 250 mg once a day for treatment of urinary tract infection pending results of the urine culture. Neurology consultation. Obtain MRI of the brain in the morning. Treatment of hypothermia is already started using heat method with [employer]. Her CODE STATUS right now is FULL CODE.  Prognosis is poor. We will monitor her progress.     TIME SPENT EVALUATING THIS PATIENT: More than one hour.     ____________________________ Carney CornersAmir M.  Rudene Rearwish, MD amd:bjt D: 01/26/2012 23:38:55 ET T: 01/27/2012 08:54:33 ET JOB#: [id number]  cc: Carney CornersAmir M. Rudene Rearwish, MD, <Dictator> Zollie ScaleAMIR M Garen Woolbright MD ELECTRONICALLY SIGNED 01/27/2012 23:40

## 2015-02-12 NOTE — Consult Note (Signed)
General Aspect 54 year old Caucasian female with past medical history of chronic obstructive pulmonary disease, tobacco abuse,  hypertension, bipolar disorder, recent admission in 07/2011, Presenting after being found down in her apartment by her friend, She was unresponsive when found. Cardiology was consulted for  elevated cardiac enz.  EMTs brought her to the ER. CT scan revealed multiple cerebral and cerebellar infarcts. drug screen was positive for cocaine, opiate, and phencyclidine.  She was intubated and connected to a ventilator on arrival.  Currently she is extubated, unable to arouse, on nasal canula, still on pressors for BP support.  PAST MEDICAL HISTORY:  1. History of chronic obstructive pulmonary disease. 2. Systemic hypertension.  3. Bipolar disorder.  4. Tobacco abuse.   ADMISSION MEDICATIONS: Per records from last admission 07/2011. 1. Valium 5 mg 4 times a day.  2. Tegretol 200 mg twice a day.  3. Norvasc 5 mg a day. 4. Norco 5/325 q. 6 hours p.r.n.  5. Zestril  20 mg a day. 6. Ambien 10 mg at bedtime. 7. Abilify 5 mg a day. 8. Advair 250/50 1 puff twice a day.   ALLERGIES: No known drug allergies.   SOCIAL HABITS: History of tobacco abuse.    Per records she was not an alcoholic.   SOCIAL HISTORY: She lives at home, visited by a friend.   FAMILY HISTORY:  positive for hypertension and coronary artery disease.   Physical Exam:   GEN well developed, well nourished, no acute distress    HEENT red conjunctivae    NECK supple     RESP normal resp effort  clear BS  snoring     CARD Regular rate and rhythm  No murmur     ABD denies tenderness  soft     LYMPH negative neck    EXTR negative edema    SKIN normal to palpation    NEURO unable to test    [facility] sedated   Review of Systems:   ROS Pt not able to provide ROS      Routine Hem:  07-Apr-13 21:03    WBC (CBC) 12.6   RBC (CBC) 3.74   Hemoglobin (CBC) 12.1   Hematocrit (CBC) 36.4    Platelet Count (CBC) 220   MCV 97   MCH 32.4   MCHC 33.3   RDW 14.8  Routine Chem:  07-Apr-13 21:03    Glucose, Serum 86   BUN 22   Creatinine (comp) 2.25   Sodium, Serum 140   Potassium, Serum 6.1   Chloride, Serum 105   CO2, Serum 24   Calcium (Total), Serum 8.1  Hepatic:  07-Apr-13 21:03    Bilirubin, Total 0.2   Alkaline Phosphatase 110   SGPT (ALT) 42   SGOT (AST) 141   Total Protein, Serum 7.4   Albumin, Serum 3.5  Routine Chem:  07-Apr-13 21:03    Osmolality (calc) 282   eGFR (African American) 30   eGFR (Non-African American) 24   Anion Gap 11  Cardiac:  07-Apr-13 21:03    Troponin I 0.89  Routine Chem:  07-Apr-13 21:03    Ethanol, S. < 3   Ethanol % (comp) < 0.003  Routine UA:  07-Apr-13 21:03    Color (UA) Amber   Clarity (UA) Cloudy   Glucose (UA) Negative   Bilirubin (UA) Negative   Ketones (UA) Negative   Specific Gravity (UA) 1.011   Blood (UA) 3+   pH (UA) 5.0   Nitrite (UA) Negative  Leukocyte Esterase (UA) Trace   RBC (UA) 1 /HPF   WBC (UA) 9 /HPF   Bacteria (UA) TRACE   Epithelial Cells (UA) 1 /HPF   Mucous (UA) PRESENT  Urine Drugs:  [id number] 13:08    Tricyclic Antidepressant, Ur Qual (comp) NEGATIVE   Amphetamines, Urine Qual. NEGATIVE   MDMA, Urine Qual. NEGATIVE   Cocaine Metabolite, Urine Qual. POSITIVE   Opiate, Urine qual POSITIVE   Phencyclidine, Urine Qual. POSITIVE   Cannabinoid, Urine Qual. NEGATIVE   Barbiturates, Urine Qual. NEGATIVE   Benzodiazepine, Urine Qual. NEGATIVE   Methadone, Urine Qual. NEGATIVE  Cardiac:  07-Apr-13 21:03    CK, Total 2652   EKG:   Interpretation EKG shows NSR with rate 58 bpm, unable to exclude old anteroseptal MI, prolonged QTc   Radiology Results:  XRay:    07-Apr-13 21:26, Chest Portable Single View   Chest Portable Single View    REASON FOR EXAM:    comatose  COMMENTS:   LMP: c    PROCEDURE: DXR - DXR PORTABLE CHEST SINGLE VIEW  - Jan 26 2012  9:26PM     RESULT:  Comparison is made to the prior exam of 10/20/2011. The lung   fields are clear. The heart, mediastinal and osseousstructures show no   significant abnormalities. An endotracheal tube is present with the tip   approximately 6 cm above the carina. A nasogastric tube is present with   the distal portion visualized inferior to the left hemidiaphragm.    IMPRESSION:   1. No acute changes are identified.  2. The endotracheal tube tip is approximately 6 cm above the carina.    Thank you for the opportunity to contribute to the care of your patient.           Verified By: Dionne Ano WALL, M.D., MD  CT:    07-Apr-13 21:34, CT Head Without Contrast   CT Head Without Contrast    REASON FOR EXAM:    comatose  COMMENTS:   May transport without cardiac monitor    PROCEDURE: CT  - CT HEAD WITHOUT CONTRAST  - Jan 26 2012  9:34PM     RESULT: Comparison:  None    Technique: Multiple axial images from the foramen magnum to the vertex   were obtained without IV contrast.    Findings:      There is no evidence of mass effect, midline shift, or extra-axial fluid   collections.  There is no evidence of a space-occupying lesion or   intracranial hemorrhage. There are large areas of bilateral cerebellar     low attenuation and most concerning for an acute-subacute infarct. There   are abnormal areas of low attenuation in the caudate lobes and basal   ganglia bilaterally concerning for acute-subacute infarcts. There is a   small area of low attenuation in the right occipital lobe concerning for   an acute-subacute infarct.    The ventricles and sulci are appropriate for the patient's age. The basal   cisterns are patent.    Visualized portions of the orbits are unremarkable. The visualized   portions of the paranasal sinuses and mastoid air cells are unremarkable.     The osseous structures are unremarkable.    IMPRESSION:    There are large areas of bilateral cerebellar low attenuation most    concerning for an acute-subacute infarct. There are abnormal areas of low   attenuation in the caudate lobes and basal ganglia bilaterally concerning  for acute-subacute infarcts.There is a small area of low attenuation in   the right occipital lobe concerning for an acute-subacute infarct. There   is no acute hemorrhage.          Verified By: Jennette Banker, M.D., MD    07-Apr-13 21:55, CT Abdomen and Pelvis With Contrast   CT Abdomen and Pelvis With Contrast    REASON FOR EXAM:    (1) IV only, obtunded, abdominal distention; (2) IV   only, obtunded, abdominal di  COMMENTS:   May transport without cardiac monitor    PROCEDURE: CT  - CT ABDOMEN / PELVIS  W  - Jan 26 2012  9:55PM     RESULT: History: Abdominalpain    Comparison:  None    Technique: Multiple axial images of the abdomen and pelvis were performed   from the lung bases to the pubic symphysis, without p.o. contrast and   with 100 ml of Isovue 370 intravenous contrast.    Findings:  There is a small area of right basilar airspace disease may resent   atelectasis versus developing infiltrate. There is no pneumothorax. The   heart size is normal.     The liver is diffusely low-attenuation likely secondary to hepatic   steatosis. There is no intrahepatic or extrahepatic biliary ductal   dilatation. There is high attenuation within the gallbladder likely   representing tiny cholelithiasis versus gallbladder sludge. The spleen   demonstrates no focal abnormality. The kidneys, adrenal glands,and   pancreas are normal. The bladder is unremarkable.     There is a nasogastric tube within the stomach. The unopacified stomach,   duodenum, small intestine, and large intestine demonstrate no dilatation.   There is mild small bowel mucosal thickening with fluid within multiple   small bowel loops and colon concerning for enterocolitis secondary to an     infectious or inflammatory etiology. There is diverticulosis without    evidence of diverticulitis.  There is no pneumoperitoneum, pneumatosis,   or portal venous gas. There is no abdominal or pelvic free fluid. There   is no lymphadenopathy.     The abdominal aorta is normal in caliber with atherosclerosis.    The osseous structures are unremarkable. There is a right-sided femoral   central venous catheter in satisfactory position.    IMPRESSION:     1. There is mild small bowel mucosal thickening with fluid within   multiple small bowel loops and colon concerning for enterocolitis   secondary to an infectious or inflammatory etiology.   2. Hepatic steatosis.          Verified By: Jennette Banker, M.D., MD    No Known Allergies:   Vital Signs/Nurse's Notes:  **Vital Signs.:   08-Apr-13 08:00   Temperature Temperature (F) 100.3   Celsius 37.9   Temperature Source oral   Pulse Pulse 90   Respirations Respirations 20   Systolic BP Systolic BP 97   Diastolic BP (mmHg) Diastolic BP (mmHg) 55   Mean BP 69   Pulse Ox % Pulse Ox % 97   Pulse Ox Heart Rate 92     Impression 54yo F with h/o tob abuse, COPD, htn, bipolar, admitted after being found unresponsive,   cocaine positive, found to have multiple CVAs, requiring respiratory support on arrival, complicated by acute shock and acute renal failure.  A/P: 1) Elevated cardiac enz/NSTEMI Demand ischemia secondary to hypoxia, cocaine (possible spasm) Unable to exclude underlying CAD --Would not recommend anticoagulation  at this time -Requiring pressors for BP support. -Echo pending  2) Resp failure on nasal canula/extubated Appears stable  3).encephlaopathy- most likley from cocaine abuse and CVA -Neurology Consulted Uncertain how long a period of hypoxia as she was found at home, unresponsive Unable to exclude cardiac thrombus as a  cause of her CVA May need TEE if clinically indicated. MRI pending  4).ARF -  possibly ATN slowly improving On gentle fluids  5).COPD medical  management   Electronic Signatures: Ida Rogue (MD)  (Signed 08-Apr-13 12:19)  Authored: General Aspect/Present Illness, History and Physical Exam, Review of System, Home Medications, Labs, EKG , Radiology, Allergies, Vital Signs/Nurse's Notes, Impression/Plan   Last Updated: 08-Apr-13 12:19 by Ida Rogue (MD)

## 2015-02-12 NOTE — Consult Note (Signed)
PATIENT NAME:  Janice Mccullough, REICHL MR#:  [id number] DATE OF BIRTH:  [birth year]  DATE OF CONSULTATION:  02/05/2012  REFERRING PHYSICIAN:  Dr. Chales Abrahams  CONSULTING PHYSICIAN:  Doralee Albino. Maryruth Bun, MD  REASON FOR CONSULTATION: Initiation of psychotropic medications for mood stabilization.  IDENTIFYING INFORMATION: Janice Mccullough is a 54 year old divorced Caucasian female currently living in the [place] area. She has one son, age 59. She is unemployed currently and has a prior diagnosis of bipolar disorder.   HISTORY OF PRESENT ILLNESS: Janice Mccullough is a 54 year old divorced Caucasian female with a prior diagnosis of bipolar disorder as well as multiple medical problems including multiple recent cerebellar and cerebral infarcts. The patient was found unresponsive in her apartment and brought to the Emergency Room with respiratory failure. Toxicology screen was positive for cocaine, opioids, and PCP. Most of the history is taken per the patient's sister as the patient herself is a very poor historian and was having significant problems with memory and confusion. Per her sister she has been abusing drugs heavily for over 20 years including cocaine and cannabis abuse as well as Robitussin and cough syrup. The patient was hospitalized at [facility] in the past on the psychiatry service back in 2011 and was followed by Dr. Terrilee Croak at [facility], but her sister does not believe she was taking her psychotropic medications prior to admission and had not been seeing a psychiatrist either. The patient herself believed she was at [facility] and the year was 1970-something. She knew that it was spring versus winter.  She could not remember using any drugs prior to admission and did not know why she was hospitalized. Insight and judgment were poor. She answered most questions with "I don't know."  Answers were brief and the patient had paucity of ideas. She did not appear to be actively psychotic or responding to  internal stimuli. She denied feeling depressed or suicidal. She denied any current auditory or visual hallucinations. She was not endorsing any paranoid thoughts or delusions. The patient again clearly was disoriented and confused and was a poor historian. Per her sister there are multiple family members with substance use and the patient's son is also currently in substance abuse treatment at [facility]. She and her son have a "codependent relationship" per the sister and that has prevented her from staying clean.   PAST PSYCHIATRIC HISTORY: The patient was hospitalized at [facility] in 2011 but no other reported inpatient psychiatric hospitalizations. She was followed by Dr. Terrilee Croak at [facility] in the past but has been noncompliant with psychotropic medications and followup per her sister. There is no history of any prior suicide attempts. It does appear that there was a positive response to Abilify in the past but the patient had difficulty affording it.  In addition, the patient was last discharged on Tegretol 200 mg p.o. b.i.d. under the care of Dr. Jennet Maduro.  It appears that there may have also been a history of benzodiazepine dependence or abuse. She was discontinued on Valium back in 2012 as well.  Toxicology screen, however, was negative for benzodiazepines.  SUBSTANCE ABUSE HISTORY:  As stated in the History of Present Illness there is a history of cocaine, cannabis, possible opioid and hallucinogen abuse. Per her sister she would drink 1 to 2 beers per day but did not have any history of any heavier alcohol use. She does smoke two packs of cigarettes per day.   FAMILY PSYCHIATRIC HISTORY: The patient's mother had a diagnosis of bipolar disorder. Her  father was an alcoholic and there are multiple family members that abuse drugs, including her son.   PAST MEDICAL HISTORY:  Chronic obstructive pulmonary disease, hypertension.   OUTPATIENT MEDICATIONS:  Last known medications prior to admission include:   1. Tegretol 200 mg p.o. b.i.d.  2. Valium 5 mg p.o. four times a day.  3. Norvasc 5 mg p.o. daily.  4. Norco 5/325 every six hours p.r.n.  5. Zestril 20 mg daily.  6. Ambien 10 mg at bedtime.  7. Abilify 5 mg daily. 8. Advair 250/50 1 puff twice a day.   INPATIENT MEDICATIONS:  1. Norvasc 10 mg daily.  2. Lexapro 10 mg daily to start on 04/15.  3. Hydralazine 25 mg p.o. t.i.d.  4. Methylprednisolone 40 mg IV push daily.  5. Aspirin 324 mg p.o. daily.  6. Lovenox injection 40 subq daily.  7. Ensure. 8. Xanax 0.25 mg p.o. every six hours p.r.n. for anxiety.   ALLERGIES: No drug allergies.   SOCIAL HISTORY: The patient was born and raised in [place], [place] by both her biological parents. Both parents are currently deceased. She has two sisters in the [place], [place] area. She has a tenth grade education and never obtained her GED. She worked in the past doing production work in a factory but was employed prior to admission. She is divorced and has a 69 year old son who is currently in substance abuse treatment at [facility].   LEGAL HISTORY: The patient does report a history of arrests in the past but cannot remember what they were for.   MENTAL STATUS EXAM: Janice Mccullough is a 54 year old Caucasian female who was sitting up in her hospital bed in a hospital gown. She was alert but disoriented. She knew she was in the hospital and called it [facility]. She believed it was 1970-something. She gave the season as being spring. She did not know why she was in the hospital and could not  remember any events prior to admission. Speech was slow and soft. The patient gave very brief responses, mostly yes or no.  Otherwise she would say, "I don't know."  The patient did have paucity of ideas. Mood was described as being "okay." Affect was flat. Thought processes were significant for paucity of ideas. Thought processes were slowed. She denied any current suicidal or homicidal  thoughts. She denied any current auditory or visual hallucinations. She denied any paranoid thoughts or delusions. She did not appear to be responding to internal stimuli. Attention and concentration were poor. Judgment and insight were poor. Recall was 3/3 initially and 3/3 after five minutes. She could do simple calculations but no serial sevens. She could spell world forwards could not spell it backwards. She named the current president as [employer]ay Records but had difficulty naming any prior presidents. The patient responded, "I don't know" to proverbs asked.   SUICIDE RISK ASSESSMENT:  Ms. Nguyen is at a low risk of intentional harm to self and others at this time, but may be at a more elevated risk of accidental harm secondary to recent strokes and cognitive disorder.   REVIEW OF SYSTEMS: CONSTITUTIONAL: The patient denied any weakness, fatigue, or weight changes. She denied any fever, chills, or night sweats. HEAD: She denied headaches or dizziness. EYES: She denied any diplopia or blurred vision. ENT: She denied any hearing loss.  RESPIRATORY: She denied any shortness breath or cough. CARDIOVASCULAR: She denied any chest pain or orthopnea. GI: She denied any abdominal pain, nausea, or vomiting. She denied  any change in bowel movements. GU: She denies incontinence or problems with frequency of urine. ENDOCRINE: She denies any heat or cold intolerance. LYMPHATIC: She denies anemia or easy bruising. MUSCULOSKELETAL: She denies any muscle or joint pain.  NEUROLOGIC: She denies any tingling or weakness. PSYCHIATRIC: Please see History of Present Illness.    VITAL SIGNS: Blood pressure 113/72, heart rate 64, respirations 20, temperature 98.1.   LABS/STUDIES: BMP within normal limits. Glucose 158. Total bilirubin 0.3, AST 38, ALT 47,  alkaline phosphatase 64, white blood cell count 8.5, hemoglobin 10.2, platelet count 161. C. difficile was negative. Urine tox screen was positive for cocaine, opioids, and PCP at  the time of admission back on 04/07.  MRI of the head on 04/09 showed extensive acute infarcts at the cerebellum bilaterally and acute infarcts at the basal ganglion hippocampi bilaterally. In addition, there were small acute infarcts seen in the right frontal lobe, right occipital lobe, and left parietal lobe, possibility of global hypoxic ischemic event.   DIAGNOSES:  AXIS I:  1. 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  2. Cognitive disorder, not otherwise specified.  3. History of bipolar disorder.  4. 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  5. Hallucinogen abuse.   AXIS II: Deferred.   AXIS III:  1. Recent multiple cerebrovascular accidents. 2. Chronic obstructive pulmonary disease.  3. Hypertension.   AXIS IV: Severe: Inability to care for self independently currently, comorbid substance use prior to admission.   AXIS V: GAF at present equals 15 to 20.   RECOMMENDATIONS:  1) Mood Disorder, NOS: The patient will be restarted on Tegretol 200 mg p.o. b.i.d. for mood stabilization as she had been on in the past and Zoloft 50 mg p.o. daily for depression.  Lexapro will be discontinued as it was only started 2 days ago. Will need to check Tegretol level and LFTs in 4-5 days. he  2) Cocaine, Opiod and Possible Benzo Abuse: The patient will be under the care of her sister after discharge and hopefully she will be able to abstain from all illicit drugs as they may worsen mood symptoms. Due to cognitive disorder, the patient did appear to have difficulty with comprehension  3) Recommend followup with [facility] at the time of discharge. She is not actively suicidal or psychotic necessitating inpatient psychiatric treatment. Treatment plan was discussed with her sister who plans to obtain POA and care for her after discharge. She was supportive of the treatment plan.  TIME SPENT: 80 minutes (mostly in gaining collaeral info from family  members)  ____________________________ Doralee AlbinoAarti K. Maryruth BunKapur, MD [id number] D: 02/05/2012 16:40:04 ET T: 02/05/2012 17:21:49 ET JOB#: [id number]  cc: Marcele Kosta K. Maryruth BunKapur, MD, <Dictator> Darliss RidgelAARTI K Ermin Parisien MD ELECTRONICALLY SIGNED 02/06/2012 4:11
